# Patient Record
Sex: Male | Born: 1990 | Marital: Single | Smoking: Former smoker
Health system: Southern US, Community
[De-identification: ages and names within clinical notes are randomized; demographics above are authoritative.]

## PROBLEM LIST (undated history)

## (undated) DIAGNOSIS — E669 Obesity, unspecified: Secondary | ICD-10-CM

## (undated) DIAGNOSIS — M549 Dorsalgia, unspecified: Secondary | ICD-10-CM

## (undated) HISTORY — PX: TONSILLECTOMY: SUR1361

## (undated) HISTORY — PX: APPENDECTOMY: SHX54

---

## 2011-05-28 ENCOUNTER — Ambulatory Visit: Payer: BC Managed Care – PPO | Attending: Physician Assistant | Admitting: Physical Therapy

## 2011-05-28 DIAGNOSIS — M545 Low back pain, unspecified: Secondary | ICD-10-CM | POA: Insufficient documentation

## 2011-05-28 DIAGNOSIS — IMO0001 Reserved for inherently not codable concepts without codable children: Secondary | ICD-10-CM | POA: Insufficient documentation

## 2011-05-29 ENCOUNTER — Ambulatory Visit: Payer: BC Managed Care – PPO | Admitting: Rehabilitation

## 2011-06-02 ENCOUNTER — Ambulatory Visit: Payer: BC Managed Care – PPO | Attending: Physician Assistant | Admitting: Rehabilitation

## 2011-06-02 DIAGNOSIS — M545 Low back pain, unspecified: Secondary | ICD-10-CM | POA: Insufficient documentation

## 2011-06-02 DIAGNOSIS — IMO0001 Reserved for inherently not codable concepts without codable children: Secondary | ICD-10-CM | POA: Insufficient documentation

## 2011-06-05 ENCOUNTER — Ambulatory Visit: Payer: BC Managed Care – PPO | Admitting: Rehabilitation

## 2011-06-09 ENCOUNTER — Ambulatory Visit: Payer: BC Managed Care – PPO | Admitting: Physical Therapy

## 2011-06-11 ENCOUNTER — Ambulatory Visit: Payer: BC Managed Care – PPO | Admitting: Rehabilitation

## 2011-06-16 ENCOUNTER — Ambulatory Visit: Payer: BC Managed Care – PPO | Admitting: Rehabilitation

## 2011-06-18 ENCOUNTER — Ambulatory Visit: Payer: BC Managed Care – PPO | Admitting: Physical Therapy

## 2013-07-31 ENCOUNTER — Emergency Department (HOSPITAL_BASED_OUTPATIENT_CLINIC_OR_DEPARTMENT_OTHER)
Admission: EM | Admit: 2013-07-31 | Discharge: 2013-07-31 | Disposition: A | Discharge status: Home / Self Care | Payer: BC Managed Care – PPO | Attending: Emergency Medicine | Admitting: Emergency Medicine

## 2013-07-31 ENCOUNTER — Encounter (HOSPITAL_BASED_OUTPATIENT_CLINIC_OR_DEPARTMENT_OTHER): Payer: Self-pay | Admitting: Emergency Medicine

## 2013-07-31 DIAGNOSIS — S39012A Strain of muscle, fascia and tendon of lower back, initial encounter: Secondary | ICD-10-CM

## 2013-07-31 DIAGNOSIS — S335XXA Sprain of ligaments of lumbar spine, initial encounter: Secondary | ICD-10-CM | POA: Insufficient documentation

## 2013-07-31 DIAGNOSIS — Z87891 Personal history of nicotine dependence: Secondary | ICD-10-CM | POA: Insufficient documentation

## 2013-07-31 DIAGNOSIS — E669 Obesity, unspecified: Secondary | ICD-10-CM | POA: Insufficient documentation

## 2013-07-31 DIAGNOSIS — X58XXXA Exposure to other specified factors, initial encounter: Secondary | ICD-10-CM | POA: Insufficient documentation

## 2013-07-31 DIAGNOSIS — Y9389 Activity, other specified: Secondary | ICD-10-CM | POA: Insufficient documentation

## 2013-07-31 DIAGNOSIS — Y929 Unspecified place or not applicable: Secondary | ICD-10-CM | POA: Insufficient documentation

## 2013-07-31 HISTORY — DX: Obesity, unspecified: E66.9

## 2013-07-31 HISTORY — DX: Dorsalgia, unspecified: M54.9

## 2013-07-31 MED ORDER — METHOCARBAMOL 500 MG PO TABS: 500.0000 mg | ORAL_TABLET | Freq: Two times a day (BID) | ORAL | Status: DC

## 2013-07-31 MED ORDER — IBUPROFEN 800 MG PO TABS: 800.0000 mg | ORAL_TABLET | Freq: Three times a day (TID) | ORAL | Status: DC

## 2013-07-31 MED ORDER — HYDROCODONE-ACETAMINOPHEN 5-325 MG PO TABS: 2.0000 | ORAL_TABLET | ORAL | Status: DC | PRN

## 2013-07-31 NOTE — ED Provider Notes (Signed)
CSN: [id number]     Arrival date & time 07/31/13  1301 History   First MD Initiated Contact with Patient 07/31/13 1331     Chief Complaint  Patient presents with  . Back Pain     (Consider location/radiation/quality/duration/timing/severity/associated sxs/prior Treatment) Patient is a 23 y.o. male presenting with back pain. The history is provided by the patient. No language interpreter was used.  Back Pain Location:  Lumbar spine Quality:  Aching Radiates to:  L posterior upper leg and R posterior upper leg Pain severity:  Moderate Pain is:  Same all the time Onset quality:  Gradual Duration:  3 days Timing:  Constant Progression:  Worsening Relieved by:  Nothing Ineffective treatments:  None tried   Past Medical History  Diagnosis Date  . Back pain   . Obesity    Past Surgical History  Procedure Laterality Date  . Appendectomy    . Tonsillectomy     No family history on file. History  Substance Use Topics  . Smoking status: Former Games developermoker  . Smokeless tobacco: Current User  . Alcohol Use: Yes    Review of Systems  Musculoskeletal: Positive for back pain.  All other systems reviewed and are negative.      Allergies  Review of patient's allergies indicates no known allergies.  Home Medications   Current Outpatient Rx  Name  Route  Sig  Dispense  Refill  . HYDROcodone-acetaminophen (NORCO/VICODIN) 5-325 MG per tablet   Oral   Take 2 tablets by mouth every 4 (four) hours as needed.   20 tablet   0   . ibuprofen (ADVIL,MOTRIN) 800 MG tablet   Oral   Take 1 tablet (800 mg total) by mouth 3 (three) times daily.   21 tablet   0   . methocarbamol (ROBAXIN) 500 MG tablet   Oral   Take 1 tablet (500 mg total) by mouth 2 (two) times daily.   20 tablet   0    BP 131/71  Pulse 86  Temp(Src) 97.7 F (36.5 C) (Oral)  Resp 20  Ht 6' (1.829 m)  Wt 375 lb (170.099 kg)  BMI 50.85 kg/m2  SpO2 98% Physical Exam  Nursing note and vitals  reviewed. Constitutional: He is oriented to person, place, and time. He appears well-developed and well-nourished.  HENT:  Head: Normocephalic.  Eyes: Conjunctivae and EOM are normal. Pupils are equal, round, and reactive to light.  Neck: Normal range of motion.  Cardiovascular: Normal rate, regular rhythm and normal heart sounds.   Pulmonary/Chest: Effort normal and breath sounds normal.  Abdominal: Soft. He exhibits no distension.  Musculoskeletal: Normal range of motion.  Neurological: He is alert and oriented to person, place, and time.  Skin: Skin is warm.  Psychiatric: He has a normal mood and affect.    ED Course  Procedures (including critical care time) Labs Review Labs Reviewed - No data to display Imaging Review No results found.   EKG Interpretation None      MDM   Final diagnoses:  Lumbar strain    Robaxin Ibuprofen Hydrocodone Follow up with your Physician for recheck     Elson AreasLeslie K Sofia, PA-C 07/31/13 1420

## 2013-07-31 NOTE — ED Notes (Signed)
Reports chronic issues with back pain.  Reports worsened last night, pain with walking and experiencing back spasms.  Radiating down both legs.

## 2013-07-31 NOTE — ED Provider Notes (Signed)
Medical screening examination/treatment/procedure(s) were performed by non-physician practitioner and as supervising physician I was immediately available for consultation/collaboration.   EKG Interpretation None        Kendrea Cerritos, MD 07/31/13 1444 

## 2013-07-31 NOTE — Discharge Instructions (Signed)
Back Pain, Adult Back pain is very common. The pain often gets better over time. The cause of back pain is usually not dangerous. Most people can learn to manage their back pain on their own.  HOME CARE   Stay active. Start with short walks on flat ground if you can. Try to walk farther each day.  Do not sit, drive, or stand in one place for more than 30 minutes. Do not stay in bed.  Do not avoid exercise or work. Activity can help your back heal faster.  Be careful when you bend or lift an object. Bend at your knees, keep the object close to you, and do not twist.  Sleep on a firm mattress. Lie on your side, and bend your knees. If you lie on your back, put a pillow under your knees.  Only take medicines as told by your doctor.  Put ice on the injured area.  Put ice in a plastic bag.  Place a towel between your skin and the bag.  Leave the ice on for 15-20 minutes, 03-04 times a day for the first 2 to 3 days. After that, you can switch between ice and heat packs.  Ask your doctor about back exercises or massage.  Avoid feeling anxious or stressed. Find good ways to deal with stress, such as exercise. GET HELP RIGHT AWAY IF:   Your pain does not go away with rest or medicine.  Your pain does not go away in 1 week.  You have new problems.  You do not feel well.  The pain spreads into your legs.  You cannot control when you poop (bowel movement) or pee (urinate).  Your arms or legs feel weak or lose feeling (numbness).  You feel sick to your stomach (nauseous) or throw up (vomit).  You have belly (abdominal) pain.  You feel like you may pass out (faint). MAKE SURE YOU:   Understand these instructions.  Will watch your condition.  Will get help right away if you are not doing well or get worse. Document Released: 10/01/2007 Document Revised: 07/07/2011 Document Reviewed: 09/02/2010 [facility] Patient Information 2014 [place], [place].  Back Exercises Back  exercises help treat and prevent back injuries. The goal of back exercises is to increase the strength of your abdominal and back muscles and the flexibility of your back. These exercises should be started when you no longer have back pain. Back exercises include:  Pelvic Tilt. Lie on your back with your knees bent. Tilt your pelvis until the lower part of your back is against the floor. Hold this position 5 to 10 sec and repeat 5 to 10 times.  Knee to Chest. Pull first 1 knee up against your chest and hold for 20 to 30 seconds, repeat this with the other knee, and then both knees. This may be done with the other leg straight or bent, whichever feels better.  Sit-Ups or Curl-Ups. Bend your knees 90 degrees. Start with tilting your pelvis, and do a partial, slow sit-up, lifting your trunk only 30 to 45 degrees off the floor. Take at least 2 to 3 seconds for each sit-up. Do not do sit-ups with your knees out straight. If partial sit-ups are difficult, simply do the above but with only tightening your abdominal muscles and holding it as directed.  Hip-Lift. Lie on your back with your knees flexed 90 degrees. Push down with your feet and shoulders as you raise your hips a couple inches off the floor; hold  hold for 10 seconds, repeat 5 to 10 times. °· Back arches. Lie on your stomach, propping yourself up on bent elbows. Slowly press on your hands, causing an arch in your low back. Repeat 3 to 5 times. Any initial stiffness and discomfort should lessen with repetition over time. °· Shoulder-Lifts. Lie face down with arms beside your body. Keep hips and torso pressed to floor as you slowly lift your head and shoulders off the floor. °Do not overdo your exercises, especially in the beginning. Exercises may cause you some mild back discomfort which lasts for a few minutes; however, if the pain is more severe, or lasts for more than 15 minutes, do not continue exercises until you see your caregiver. Improvement with  exercise therapy for back problems is slow.  °See your caregivers for assistance with developing a proper back exercise program. °Document Released: 05/22/2004 Document Revised: 07/07/2011 Document Reviewed: 02/13/2011 °[employer]® Patient Information ©2014 [employer]. ° °

## 2013-08-01 NOTE — ED Notes (Signed)
Pt called requesting return to work note for tomorrow. Vickie, RN printed note and will leave at front desk.

## 2013-08-25 ENCOUNTER — Ambulatory Visit: Payer: Self-pay

## 2013-08-25 ENCOUNTER — Other Ambulatory Visit: Payer: Self-pay | Admitting: Occupational Medicine

## 2013-08-25 DIAGNOSIS — R52 Pain, unspecified: Secondary | ICD-10-CM

## 2014-01-18 ENCOUNTER — Encounter (HOSPITAL_COMMUNITY): Payer: Self-pay | Admitting: Emergency Medicine

## 2014-01-18 ENCOUNTER — Emergency Department (HOSPITAL_COMMUNITY): Payer: Worker's Compensation

## 2014-01-18 ENCOUNTER — Emergency Department (HOSPITAL_COMMUNITY)
Admission: EM | Admit: 2014-01-18 | Discharge: 2014-01-18 | Disposition: A | Discharge status: Home / Self Care | Payer: Worker's Compensation | Attending: Emergency Medicine | Admitting: Emergency Medicine

## 2014-01-18 DIAGNOSIS — S99919A Unspecified injury of unspecified ankle, initial encounter: Secondary | ICD-10-CM

## 2014-01-18 DIAGNOSIS — S82891A Other fracture of right lower leg, initial encounter for closed fracture: Secondary | ICD-10-CM

## 2014-01-18 DIAGNOSIS — Z87891 Personal history of nicotine dependence: Secondary | ICD-10-CM | POA: Diagnosis not present

## 2014-01-18 DIAGNOSIS — S8990XA Unspecified injury of unspecified lower leg, initial encounter: Secondary | ICD-10-CM | POA: Insufficient documentation

## 2014-01-18 DIAGNOSIS — S92109A Unspecified fracture of unspecified talus, initial encounter for closed fracture: Secondary | ICD-10-CM | POA: Diagnosis not present

## 2014-01-18 DIAGNOSIS — Y9289 Other specified places as the place of occurrence of the external cause: Secondary | ICD-10-CM | POA: Insufficient documentation

## 2014-01-18 DIAGNOSIS — X500XXA Overexertion from strenuous movement or load, initial encounter: Secondary | ICD-10-CM | POA: Diagnosis not present

## 2014-01-18 DIAGNOSIS — S99929A Unspecified injury of unspecified foot, initial encounter: Secondary | ICD-10-CM

## 2014-01-18 DIAGNOSIS — Y990 Civilian activity done for income or pay: Secondary | ICD-10-CM | POA: Diagnosis not present

## 2014-01-18 MED ORDER — OXYCODONE-ACETAMINOPHEN 5-325 MG PO TABS: 1.0000 | ORAL_TABLET | Freq: Four times a day (QID) | ORAL | Status: AC | PRN

## 2014-01-18 NOTE — ED Provider Notes (Signed)
Medical screening examination/treatment/procedure(s) were performed by non-physician practitioner and as supervising physician I was immediately available for consultation/collaboration.   EKG Interpretation None        David H Yao, MD 01/18/14 2350 

## 2014-01-18 NOTE — ED Provider Notes (Signed)
CSN: [id number]     Arrival date & time 01/18/14  1641 History  This chart was scribed for non-physician practitioner, Junius Finner, PA-C,working with Richardean Canal, MD, by Karle Plumber, ED Scribe. This patient was seen in room TR08C/TR08C and the patient's care was started at 5:58 PM.  Chief Complaint  Patient presents with  . Ankle Pain   Patient is a 23 y.o. male presenting with ankle pain. The history is provided by the patient. No language interpreter was used.  Ankle Pain  HPI Comments:  Edward Park is a 23 y.o. morbidly obese male with PMH of chronic back pain who presents to the Emergency Department complaining of sudden onset aching, throbbing right ankle pain secondary to twisting his ankle approximately three hours ago. Pt reports associated numbness of the foot. He reports the pain at 6 or 7/10. Pt has iced the area with minimal relief of the pain. He has not taken any medications for his pain. He states he heard and felt a pop at the time of the accident. He states he has never injured this ankle before. He denies any bruising, wound, nausea, vomiting or falling at the time of the incident. He denies right knee or hip pain.  Past Medical History  Diagnosis Date  . Back pain   . Obesity    Past Surgical History  Procedure Laterality Date  . Appendectomy    . Tonsillectomy     History reviewed. No pertinent family history. History  Substance Use Topics  . Smoking status: Former Games developer  . Smokeless tobacco: Current User  . Alcohol Use: Yes    Review of Systems  Gastrointestinal: Negative for nausea and vomiting.  Musculoskeletal: Positive for arthralgias and joint swelling.  Skin: Negative for color change and wound.  All other systems reviewed and are negative.   Allergies  Review of patient's allergies indicates no known allergies.  Home Medications   Prior to Admission medications   Medication Sig Start Date End Date Taking? Authorizing Provider   oxyCODONE-acetaminophen (PERCOCET/ROXICET) 5-325 MG per tablet Take 1-2 tablets by mouth every 6 (six) hours as needed for moderate pain or severe pain. 01/18/14   Junius Finner, PA-C   .BP 149/58  Pulse 108  Temp(Src) 98.2 F (36.8 C) (Oral)  Resp 18  Ht 6' (1.829 m)  Wt 350 lb (158.759 kg)  BMI 47.46 kg/m2  SpO2 95% Physical Exam  Nursing note and vitals reviewed. Constitutional: He is oriented to person, place, and time. He appears well-developed and well-nourished.  Morbidly obese.  HENT:  Head: Normocephalic and atraumatic.  Eyes: EOM are normal.  Neck: Normal range of motion.  Cardiovascular: Normal rate.   Pedal pulses 2+ of right foot.  Pulmonary/Chest: Effort normal.  Musculoskeletal: Normal range of motion. He exhibits edema and tenderness.  Tenderness to palpation of lateral aspect of right ankle with moderate edema. Tenderness to palpation over lateral malleolus. Decreased ROM of right ankle and toes secondary to pain.  Neurological: He is alert and oriented to person, place, and time.  Minimally decreased sensation in right foot to light touch.   Skin: Skin is warm and dry.  Thickened, yellow toe nails of right foot.  Psychiatric: He has a normal mood and affect. His behavior is normal.    ED Course  Procedures (including critical care time) DIAGNOSTIC STUDIES: Oxygen Saturation is 95% on RA, adequate by my interpretation.   COORDINATION OF CARE: 6:00 PM- Offered pain medication but pt declined. Will  wait for X-Rays to result. Pt verbalizes understanding and agrees to plan.  Medications - No data to display  Labs Review Labs Reviewed - No data to display  Imaging Review Dg Ankle Complete Right  01/18/2014   CLINICAL DATA:  Twisted right ankle today and heard a it, swelling over lateral malleolus, bruising on medial malleolus  EXAM: RIGHT ANKLE - COMPLETE 3+ VIEW  COMPARISON:  12/15/2007  FINDINGS: There is no evidence of disruption of the mortise. There is  moderate soft tissue swelling surrounding the ankle. The medial and lateral malleoli are intact. There is a tiny avulsion fragment off of the lateral aspect of the talus. This is not seen on prior studies.  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 There is a small ankle joint effusion. There is a moderate calcaneal spur.  IMPRESSION: Ankle joint effusion with evidence sprain and with tiny avulsion fracture off of the lateral inferior talus.   Electronically Signed   By: Esperanza Heir M.D.   On: 01/18/2014 18:58     EKG Interpretation None      MDM   Final diagnoses:  Avulsion fracture of right ankle, closed, initial encounter    Pt is a 23yo male presenting to ED with c/o right ankle pain and swelling after twisting his foot at work earlier today.  Right foot is vascularly in tact with minimal decreased sensation to light touch in right foot.  Tender to palpation with moderate edema.  Plain films: significant for effusion w/ evidence of sprain with tiny avulsion fracture off lateral inferior talus.  Will place pt in cam walker boot and provided crutches. Work note provided. Advised to f/u with Dr. Ave Filter, [facility], in 1 week for further evaluation and treatment of ankle fracture. Home care instructions provided. Return precautions provided. Pt verbalized understanding and agreement with tx plan.   I personally performed the services described in this documentation, which was scribed in my presence. The recorded information has been reviewed and is accurate.   Junius Finner, PA-C 01/18/14 2226

## 2014-01-18 NOTE — ED Notes (Signed)
Patient returned from X-ray 

## 2014-01-18 NOTE — Discharge Instructions (Signed)
Ankle Fracture with Rehab Two bones in the ankle, the shinbone (tibia) and the bone of the outer ankle and lower leg (fibula), are susceptible to being fractured. The fracture may be a complete or an incomplete break of the bone. It is also common for the ligaments of the ankle joint to be injured at the same time as a fracture. SYMPTOMS   Severe pain in the ankle at the time of injury and/or when trying to move the ankle.  Feeling of popping or tearing in the inner or outer part of the ankle, sometimes as if the ankle joint was temporarily dislocated and popped back into place.  Cracking or other sounds may be heard at the time of fracture.  Severe tenderness in the ankle.  Swelling in the ankle and foot  Blisters around the ankle (uncommon).  Bleeding and bruising (contusion) in the ankle and foot.  Inability to stand or bear weight on the injured foot.  Visible deformity if the fracture is complete and the bone fragments separate enough to distort normal leg contours.  Numbness and coldness in the foot if the blood supply is impaired. CAUSES  Bones break when subjected to a force that is greater than the their strength. Most fractures are due to direct trauma, such as being hit with an object or falling.  Fractured may also be caused by indirect stress, such as twisting, pivoting, or violent muscle contraction . RISK INCREASES WITH:  Sports that require quick changes in direction (football, soccer, or skiing).  Sports that require jumping (basketball, volleyball, distance jumping, or high jumping).  Walking or running on uneven or rough surfaces.  Shoes with inadequate support to prevent the foot and ankle from rolling over when stress occurs.  Bony abnormalities (osteoporosis or bone tumors).  Metabolic disorders, hormone problems, and nutritional deficiencies and disorders.  Poor strength and flexibility  Previous ankle injury. PREVENTION   Warm up and stretch  properly before activity.  Maintain physical fitness:  Leg and ankle strength.  Flexibility and endurance.  Cardiovascular fitness.  Wear properly fitted protective equipment (high-top shoes or when appropriate and ankle bracing, taping, or splinting), especially for the first 12 months after an ankle injury. PROGNOSIS  If treated properly, ankle fractures typically heal well. RELATED COMPLICATIONS   Failure to heal (nonunion).  Healing in poor position (malunion).  Arrest of normal bone growth in children.  Proneness to repeated ankle injury.  Stiff ankle.  Unstable or arthritic ankle.  Infected skin blisters.  Prolonged healing time if activity is resumed too quickly. Risks of surgery, including infection, bleeding, injury to nerves (numbness, weakness, paralysis), and need for further surgery. TREATMENT  Treatment initially consists of ice and medication to help reduce pain and inflammation. The joint must be immobilized to allow for healing. If the fracture is where the bones are out of alignment (displaced), then surgery may be necessary to realign (reduce) them. Surgery usually involves placing pins and screws in the bones to hold them in place while the fracture heals. After surgery the joint is immobilized. Bone growth stimulators may be used to promote bone growth, but this is uncommon, Strengthening and stretching exercises are usually necessary after immobilization in order to regain strength and a full range of motion. These exercises may be completed at home or with a therapist. If pins and screws are placed in the bone, they are not usually removed unless they become a source of pain. MEDICATION   If pain medication is necessary,  then nonsteroidal anti-inflammatory medications, such as aspirin and ibuprofen, or other minor pain relievers, such as acetaminophen, are often recommended.  Do not take pain medication within 7 days before surgery.  Prescription pain  relievers may be prescribed if deemed necessary by your caregiver. Use only as directed and only as much as you need. SEEK MEDICAL CARE IF:   Symptoms get worse or do not improve in 2 weeks despite treatment.  The following occur after immobilization or surgery:  Swelling above or below the fracture site.  Severe, persistent pain.  Blue or gray skin below the fracture site, especially under the nails, or numbness or loss of feeling below the fracture site. Report any of these signs immediately.  New, unexplained symptoms develop (drugs used in treatment may produce side effects). EXERCISES      Hold stretches for 20-30 seconds, repeat 2-3 times each, perform exercises 2-3 times a day.   RANGE OF MOTION (ROM) AND STRETCHING EXERCISES - Ankle Fracture These exercises may help you when beginning to rehabilitate your injury. Your symptoms may resolve with or without further involvement from your physician, physical therapist or athletic trainer. While completing these exercises, remember:   Restoring tissue flexibility helps normal motion to return to the joints. This allows healthier, less painful movement and activity.  An effective stretch should be held for at least 30 seconds.  A stretch should never be painful. You should only feel a gentle lengthening or release in the stretched tissue. RANGE OF MOTION - Dorsi/Plantar Flexion  While sitting with your right / left knee straight, draw the top of your foot upwards by flexing your ankle. Then reverse the motion, pointing your toes downward.  Hold each position for __________ seconds.  After completing your first set of exercises, repeat this exercise with your knee bent. Repeat __________ times. Complete this exercise __________ times per day.  RANGE OF MOTION- Ankle Plantar Flexion   Sit with your right / left leg crossed over your opposite knee.  Use your opposite hand to pull the top of your foot and toes toward you.  You  should feel a gentle stretch on the top of your foot/ankle. Hold this position for __________ seconds. Repeat __________ times. Complete __________ times per day.  RANGE OF MOTION - Ankle Eversion  Sit with your right / left ankle crossed over your opposite knee.  Grip your foot with your opposite hand, placing your thumb on the top of your foot and your fingers across the bottom of your foot.  Gently push your foot downward with a slight rotation so your littlest toes rise slightly  You should feel a gentle stretch on the inside of your ankle. Hold the stretch for __________ seconds. Repeat __________ times. Complete this exercise __________ times per day.  RANGE OF MOTION - Ankle Inversion  Sit with your right / left ankle crossed over your opposite knee.  Grip your foot with your opposite hand, placing your thumb on the bottom of your foot and your fingers across the top of your foot.  Gently pull your foot so the smallest toe comes toward you and your thumb pushes the inside of the ball of your foot away from you.  You should feel a gentle stretch on the outside of your ankle. Hold the stretch for __________ seconds. Repeat __________ times. Complete this exercise __________ times per day.  RANGE OF MOTION - Ankle Alphabet  Imagine your right / left big toe is a pen.  Keeping  your hip and knee still, write out the entire alphabet with your "pen." Make the letters as large as you can without increasing any discomfort. Repeat __________ times. Complete this exercise __________ times per day.  RANGE OF MOTION - Ankle Dorsiflexion, Active Assisted   Remove shoes and sit on a chair that is preferably not on a carpeted surface.  Place right / left foot under knee. Extend your opposite leg for support.  Keeping your heel down, slide your right / left foot back toward the chair until you feel a stretch at your ankle or calf. If you do not feel a stretch, slide your bottom forward to the  edge of the chair, while still keeping your heel down.  Hold this stretch for __________ seconds. Repeat __________ times. Complete this stretch __________ times per day.  STRETCH - Gastrocsoleus   Sit with your right / left leg extended. Holding onto both ends of a belt or towel, loop it around the ball of your foot.  Keeping your right / left ankle and foot relaxed and your knee straight, pull your foot and ankle toward you using the belt/towel.  You should feel a gentle stretch behind your calf or knee. Hold this position for __________ seconds. Repeat __________ times. Complete this stretch __________ times per day.  STRENGTHENING EXERCISES - Ankle Fracture These exercises may help you when beginning to rehabilitate your injury. They may resolve your symptoms with or without further involvement from your physician, physical therapist or athletic trainer. While completing these exercises, remember:   Muscles can gain both the endurance and the strength needed for everyday activities through controlled exercises.  Complete these exercises as instructed by your physician, physical therapist or athletic trainer. Progress the resistance and repetitions only as guided.  You may experience muscle soreness or fatigue, but the pain or discomfort you are trying to eliminate should never worsen during these exercises. If this pain does worsen, stop and make certain you are following the directions exactly. If the pain is still present after adjustments, discontinue the exercise until you can discuss the trouble with your clinician. STRENGTH - Dorsiflexors  Secure a rubber exercise band/tubing to a fixed object (table, pole) and loop the other end around your right / left foot.  Sit on the floor facing the fixed object. The band/tubing should be slightly tense when your foot is relaxed.  Slowly draw your foot back toward you using your ankle and toes.  Hold this position for __________ seconds.  Slowly release the tension in the band and return your foot to the starting position. Repeat __________ times. Complete this exercise __________ times per day.  STRENGTH - Plantar-flexors  Sit with your right / left leg extended. Holding onto both ends of a rubber exercise band/tubing, loop it around the ball of your foot. Keep a slight tension in the band.  Slowly push your toes away from you, pointing them downward.  Hold this position for __________ seconds. Return slowly, controlling the tension in the band/tubing. Repeat __________ times. Complete this exercise __________ times per day.  STRENGTH - Ankle Eversion  Secure one end of a rubber exercise band/tubing to a fixed object (table, pole). Loop the other end around your foot just before your toes.  Place your fists between your knees. This will focus your strengthening at your ankle.  Drawing the band/tubing across your opposite foot, slowly, pull your little toe out and up. Make sure the band/tubing is positioned to resist the entire  motion.  Hold this position for __________ seconds.  Have your muscles resist the band/tubing as it slowly pulls your foot back to the starting position. Repeat __________ times. Complete this exercise __________ times per day.  STRENGTH - Ankle Inversion  Secure one end of a rubber exercise band/tubing to a fixed object (table, pole). Loop the other end around your foot just before your toes.  Place your fists between your knees. This will focus your strengthening at your ankle.  Slowly, pull your big toe up and in, making sure the band/tubing is positioned to resist the entire motion.  Hold this position for __________ seconds.  Have your muscles resist the band/tubing as it slowly pulls your foot back to the starting position. Repeat __________ times. Complete this exercises __________ times per day.  STRENGTH - Towel Curls  Sit in a chair positioned on a non-carpeted surface.  Place  your foot on a towel, keeping your heel on the floor.  Pull the towel toward your heel by only curling your toes. Keep your heel on the floor.  If instructed by your physician, physical therapist or athletic trainer, add weight at the end of the towel. Repeat __________ times. Complete this exercise __________ times per day. STRENGTH - Plantar-flexors, Standing   Stand with your feet shoulder width apart. Steady yourself with a wall or table using as little support as needed.  Keeping your weight evenly spread over the width of your feet, rise up on your toes.*  Hold this position for __________ seconds. Repeat __________ times. Complete this exercise __________ times per day.  *If this is too easy, shift your weight toward your right / left leg until you feel challenged. Ultimately, you may be asked to do this exercise with your right / left foot only. Document Released: 11/13/2004 Document Revised: 07/07/2011 Document Reviewed: 07/27/2008 [facility] Patient Information 2015 [place], [place]. This information is not intended to replace advice given to you by your health care provider. Make sure you discuss any questions you have with your health care provider.

## 2014-01-18 NOTE — ED Notes (Signed)
Paged Ortho  

## 2014-01-18 NOTE — Progress Notes (Signed)
Orthopedic Tech Progress Note Patient Details:  Edward Park 1991/04/07 [id number]  Ortho Devices Type of Ortho Device: CAM walker;Crutches Ortho Device/Splint Location: rle Ortho Device/Splint Interventions: Application   [name] 01/18/2014, 8:25 PM

## 2014-01-18 NOTE — ED Notes (Signed)
Reports twisting his right ankle this afternoon and now having pain and swelling.

## 2015-05-13 IMAGING — CR DG HAND COMPLETE 3+V*L*
3 series · 3 of 3 positions shown · non-contrast
Comparison: None.

CLINICAL DATA: Left arm pain

EXAM:
LEFT HAND - COMPLETE 3+ VIEW; LEFT WRIST - COMPLETE 3+ VIEW; LEFT
FOREARM - 2 VIEW

[view not recorded (1 of 3)]
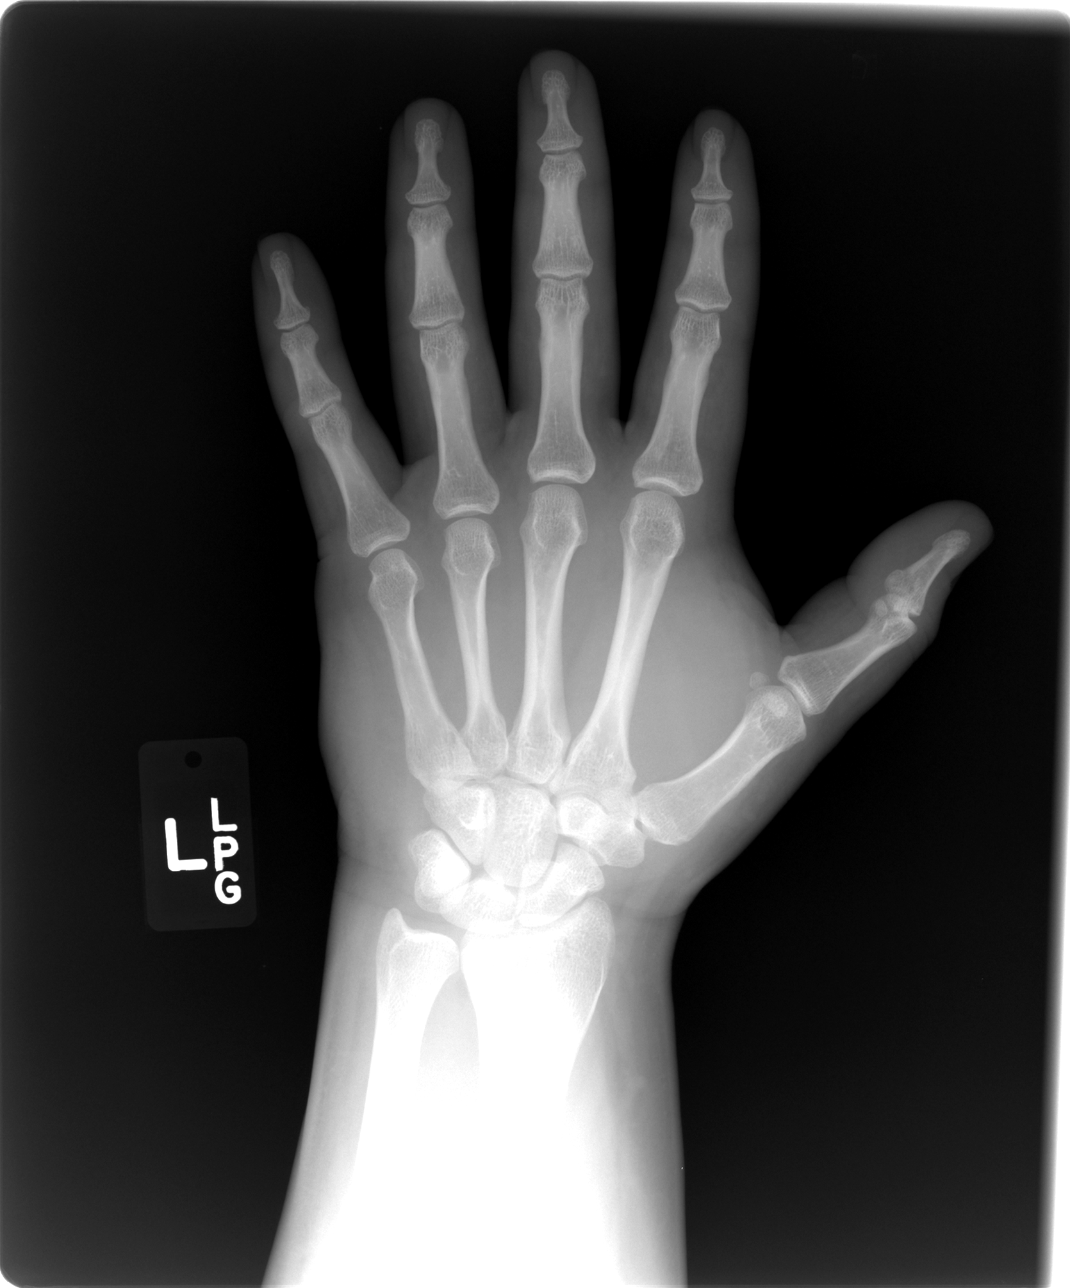

[view not recorded (2 of 3)]
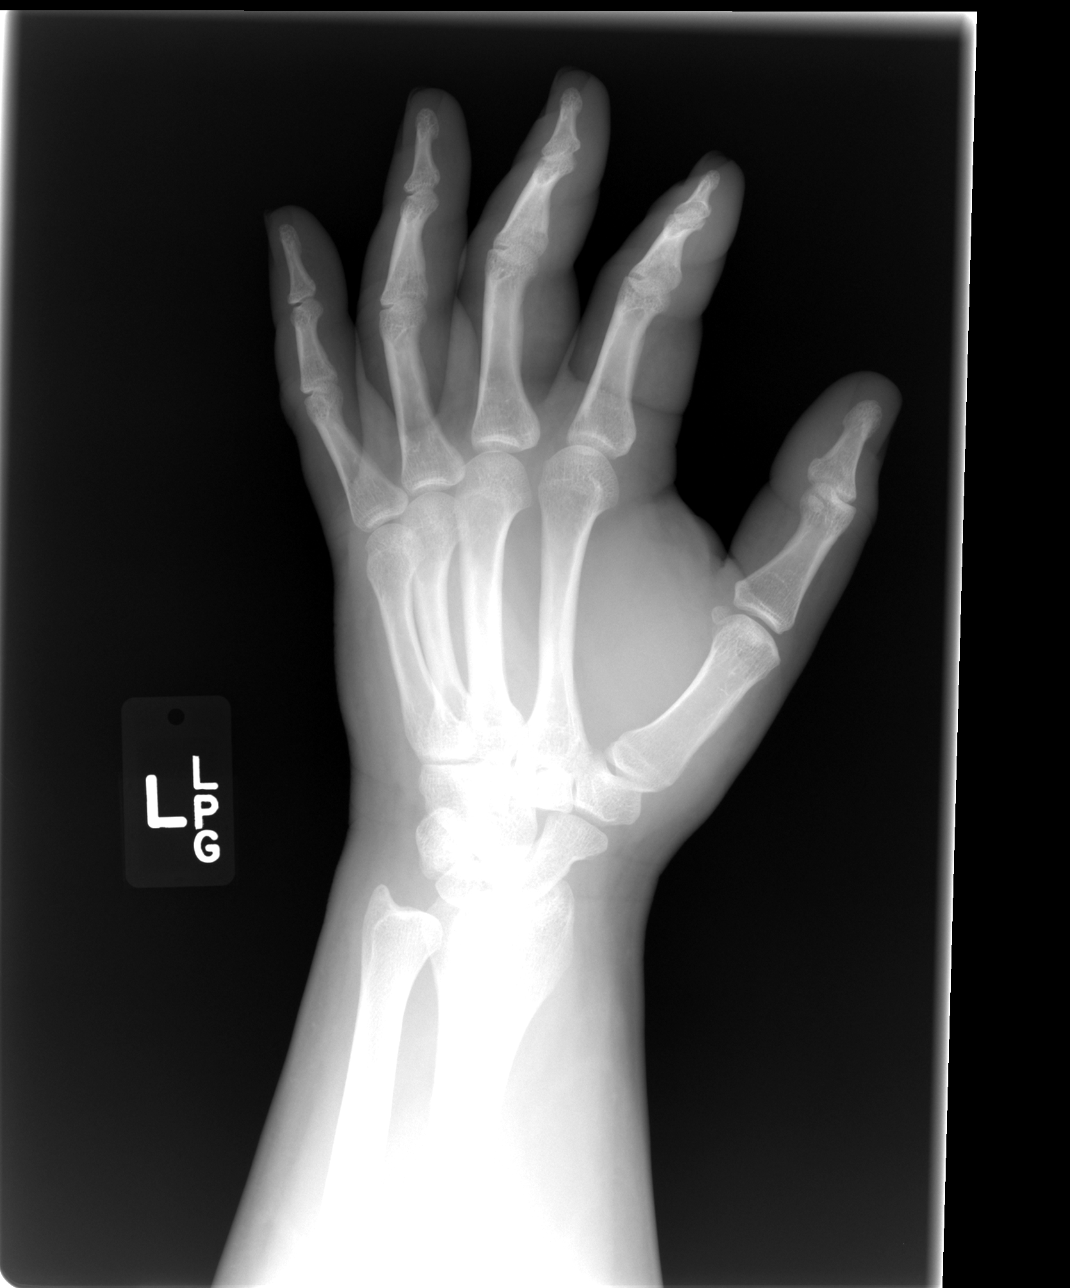

[view not recorded (3 of 3)]
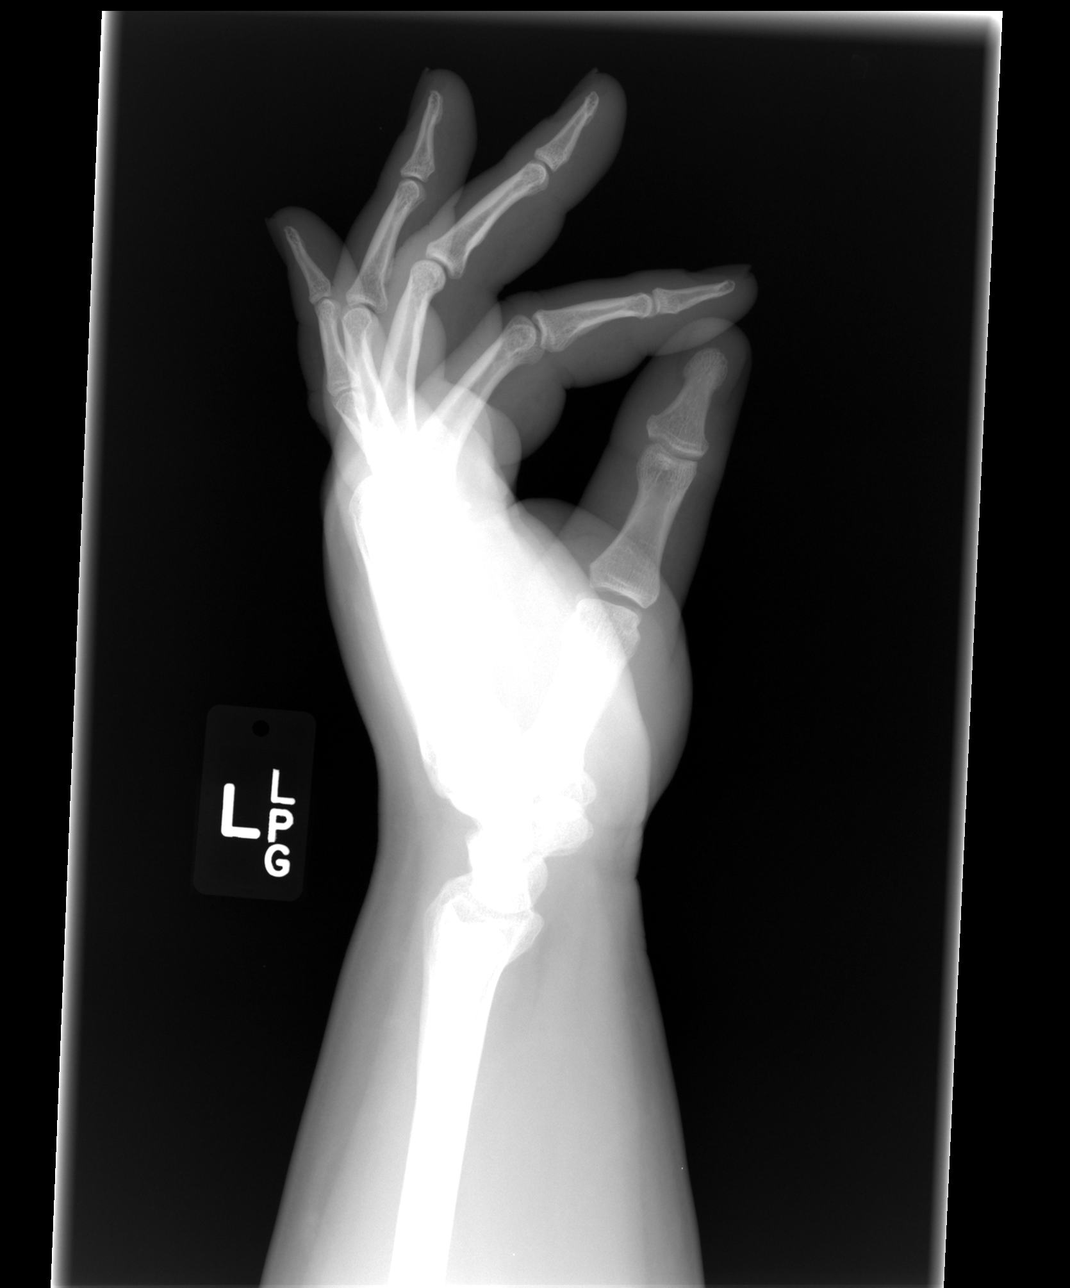

[3 of 3 positions shown; findings below may reference images not displayed]

FINDINGS: Left wrist: There is no fracture or dislocation. There is normal
alignment. Normal soft tissues. There is no radiopaque foreign body.

Left Hand: The left hand demonstrates no fracture or dislocation.
There is no soft tissue swelling.

Left radius/ulna: There is no evidence of fracture or dislocation.
There is no evidence of arthropathy or other focal bone abnormality.
Soft tissues are unremarkable.
IMPRESSION: 1. No acute osseous injury of the left wrist.
2. No acute osseous injury left hand.
3. No acute osseous injury of the left radius/ulna.

## 2015-05-13 IMAGING — CR DG WRIST COMPLETE 3+V*L*
4 series · 4 of 4 positions shown · non-contrast
Comparison: None.

CLINICAL DATA: Left arm pain

EXAM:
LEFT HAND - COMPLETE 3+ VIEW; LEFT WRIST - COMPLETE 3+ VIEW; LEFT
FOREARM - 2 VIEW

[view not recorded (1 of 4)]
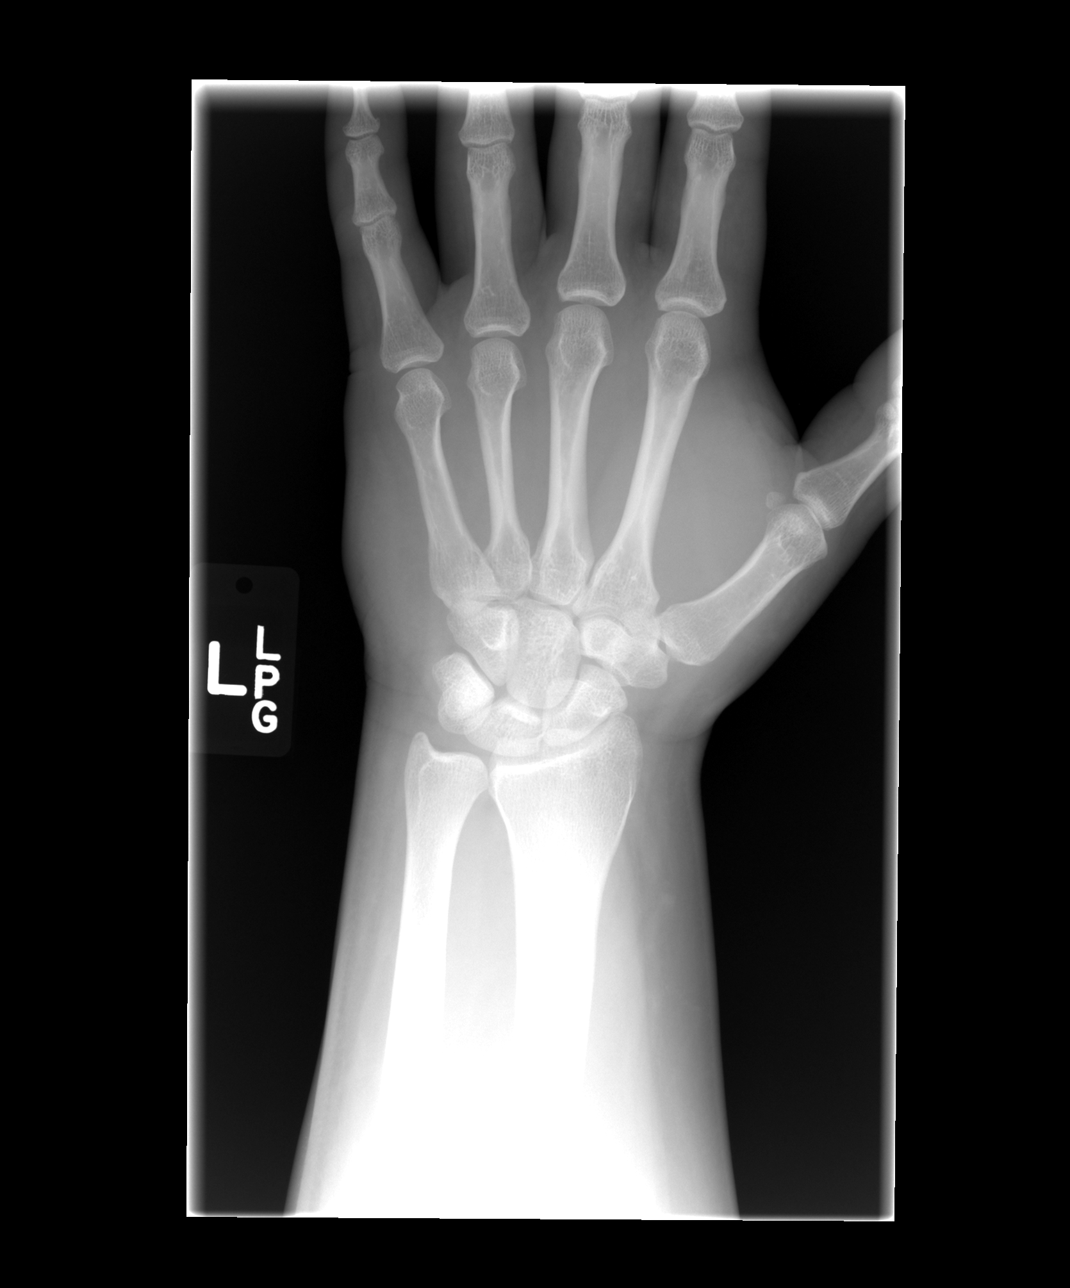

[view not recorded (2 of 4)]
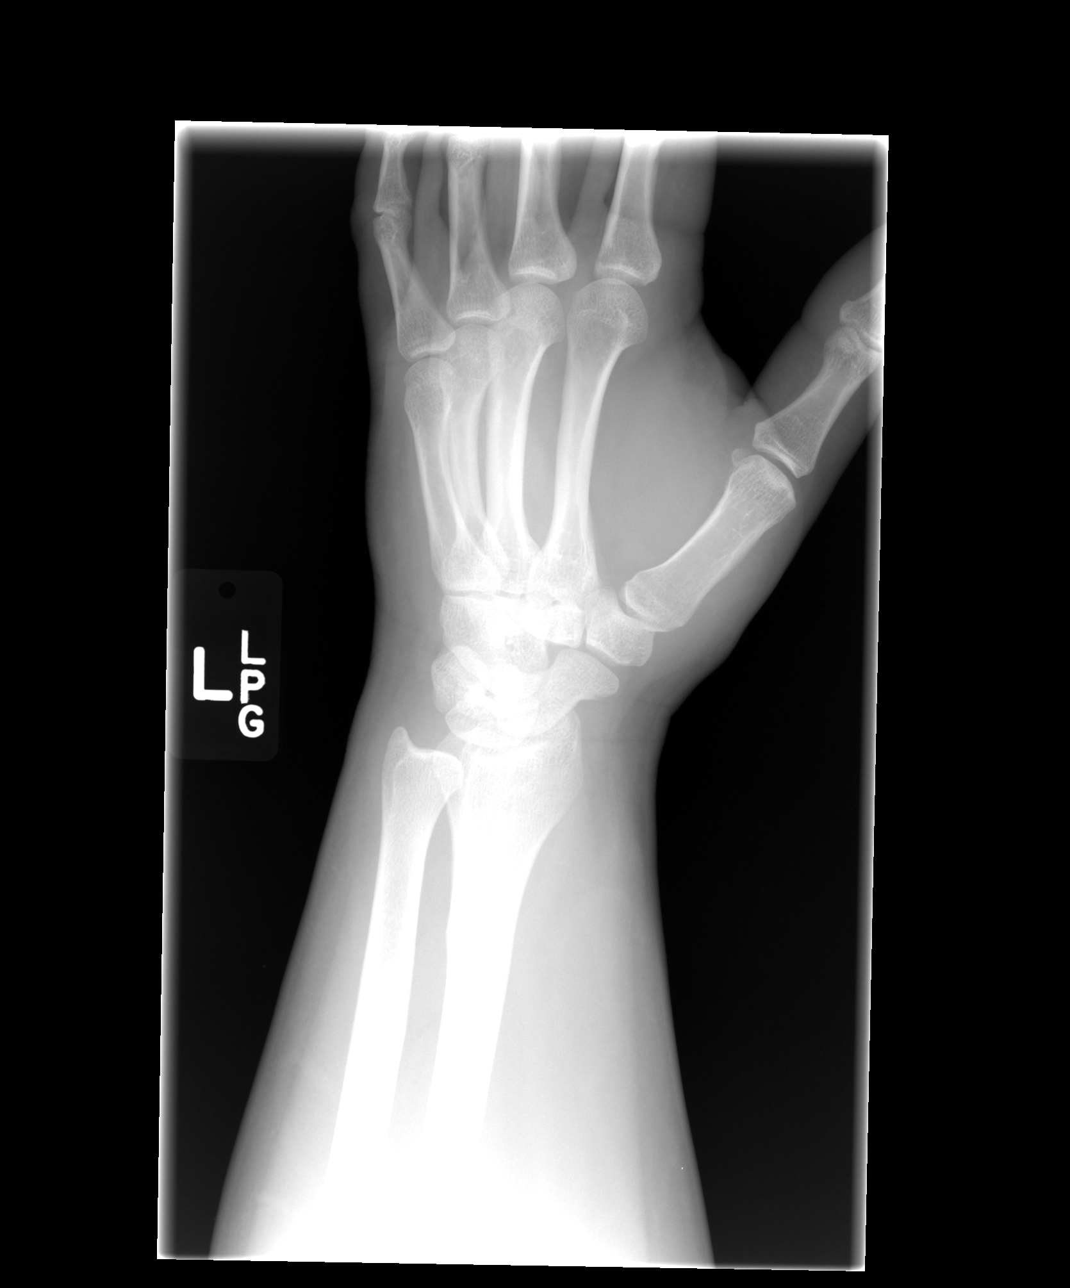

[view not recorded (3 of 4)]
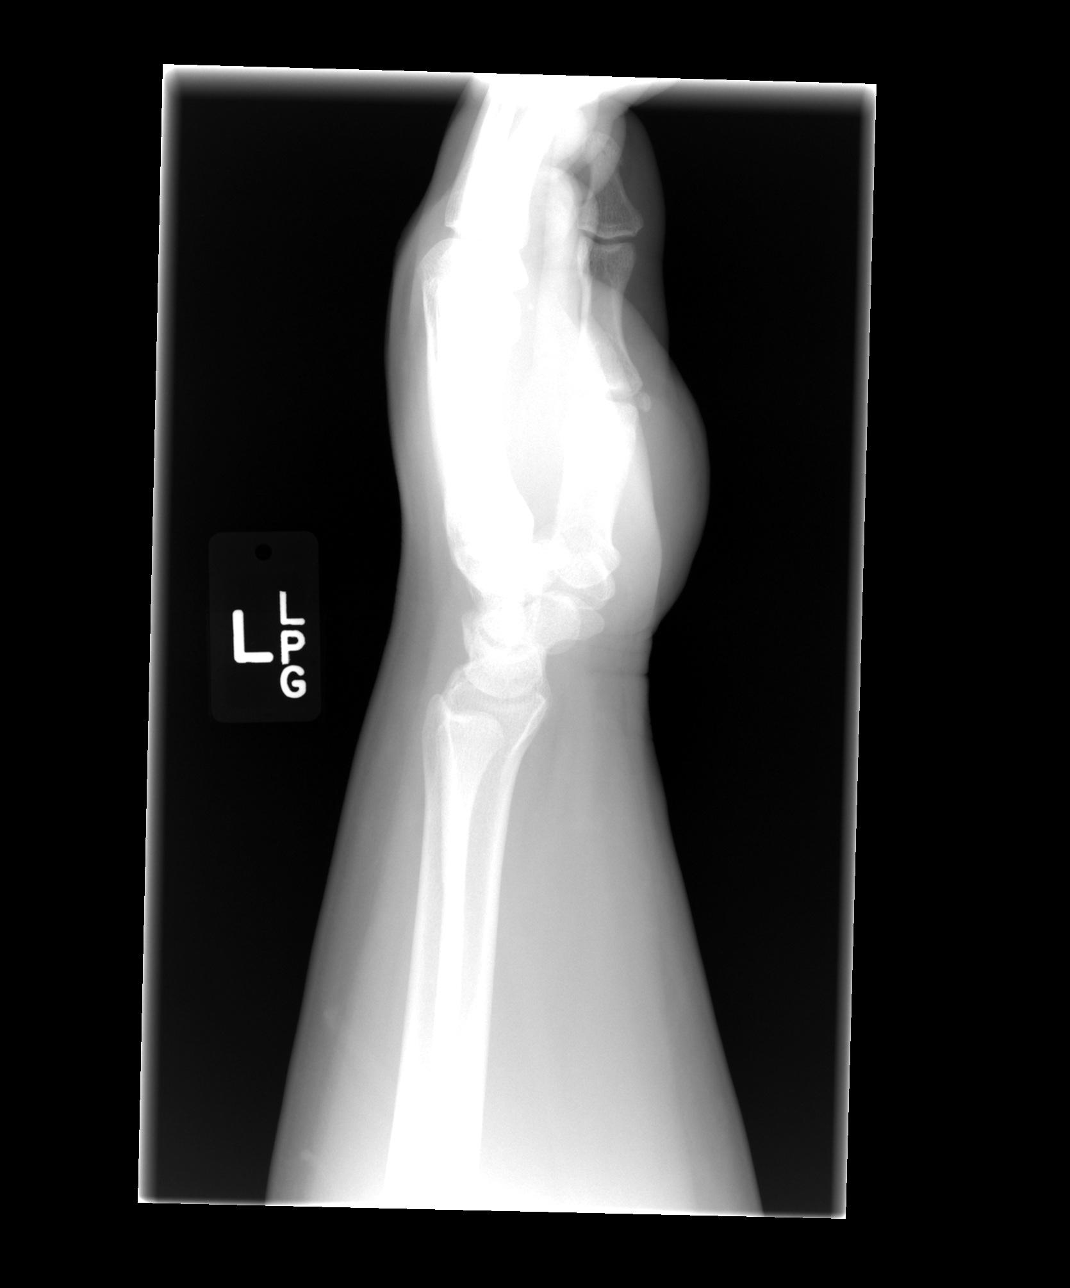

[view not recorded (4 of 4)]
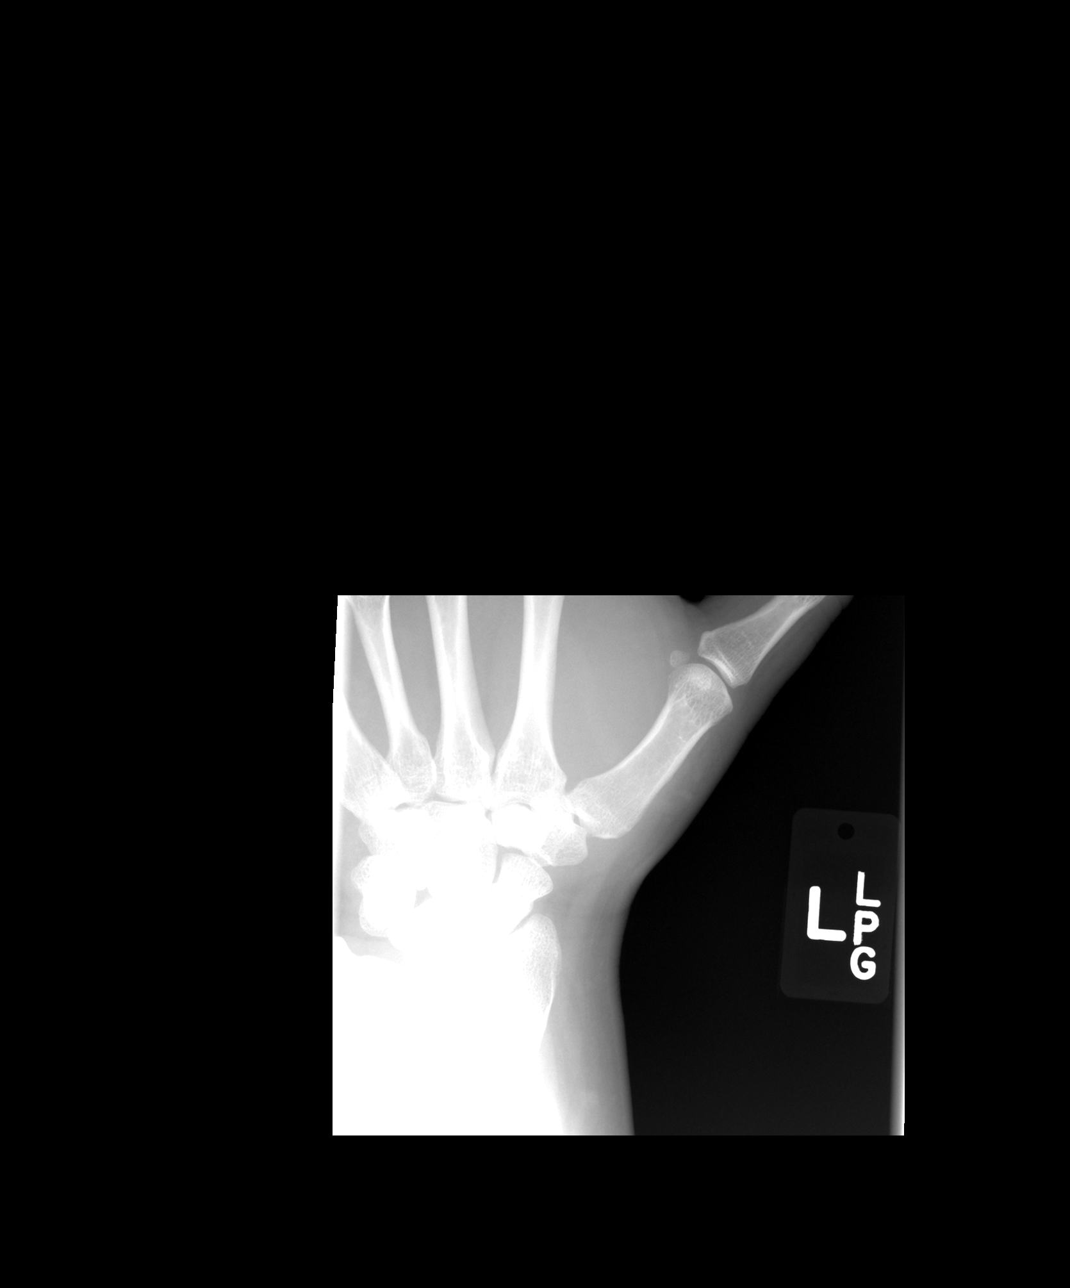

[4 of 4 positions shown; findings below may reference images not displayed]

FINDINGS: Left wrist: There is no fracture or dislocation. There is normal
alignment. Normal soft tissues. There is no radiopaque foreign body.

Left Hand: The left hand demonstrates no fracture or dislocation.
There is no soft tissue swelling.

Left radius/ulna: There is no evidence of fracture or dislocation.
There is no evidence of arthropathy or other focal bone abnormality.
Soft tissues are unremarkable.
IMPRESSION: 1. No acute osseous injury of the left wrist.
2. No acute osseous injury left hand.
3. No acute osseous injury of the left radius/ulna.

## 2015-10-06 IMAGING — CR DG ANKLE COMPLETE 3+V*R*
3 series · 3 of 3 positions shown · non-contrast
Comparison: 12/15/2007

CLINICAL DATA: Twisted right ankle today and heard a it, swelling
over lateral malleolus, bruising on medial malleolus

EXAM:
RIGHT ANKLE - COMPLETE 3+ VIEW

[t ankle joint ap right]
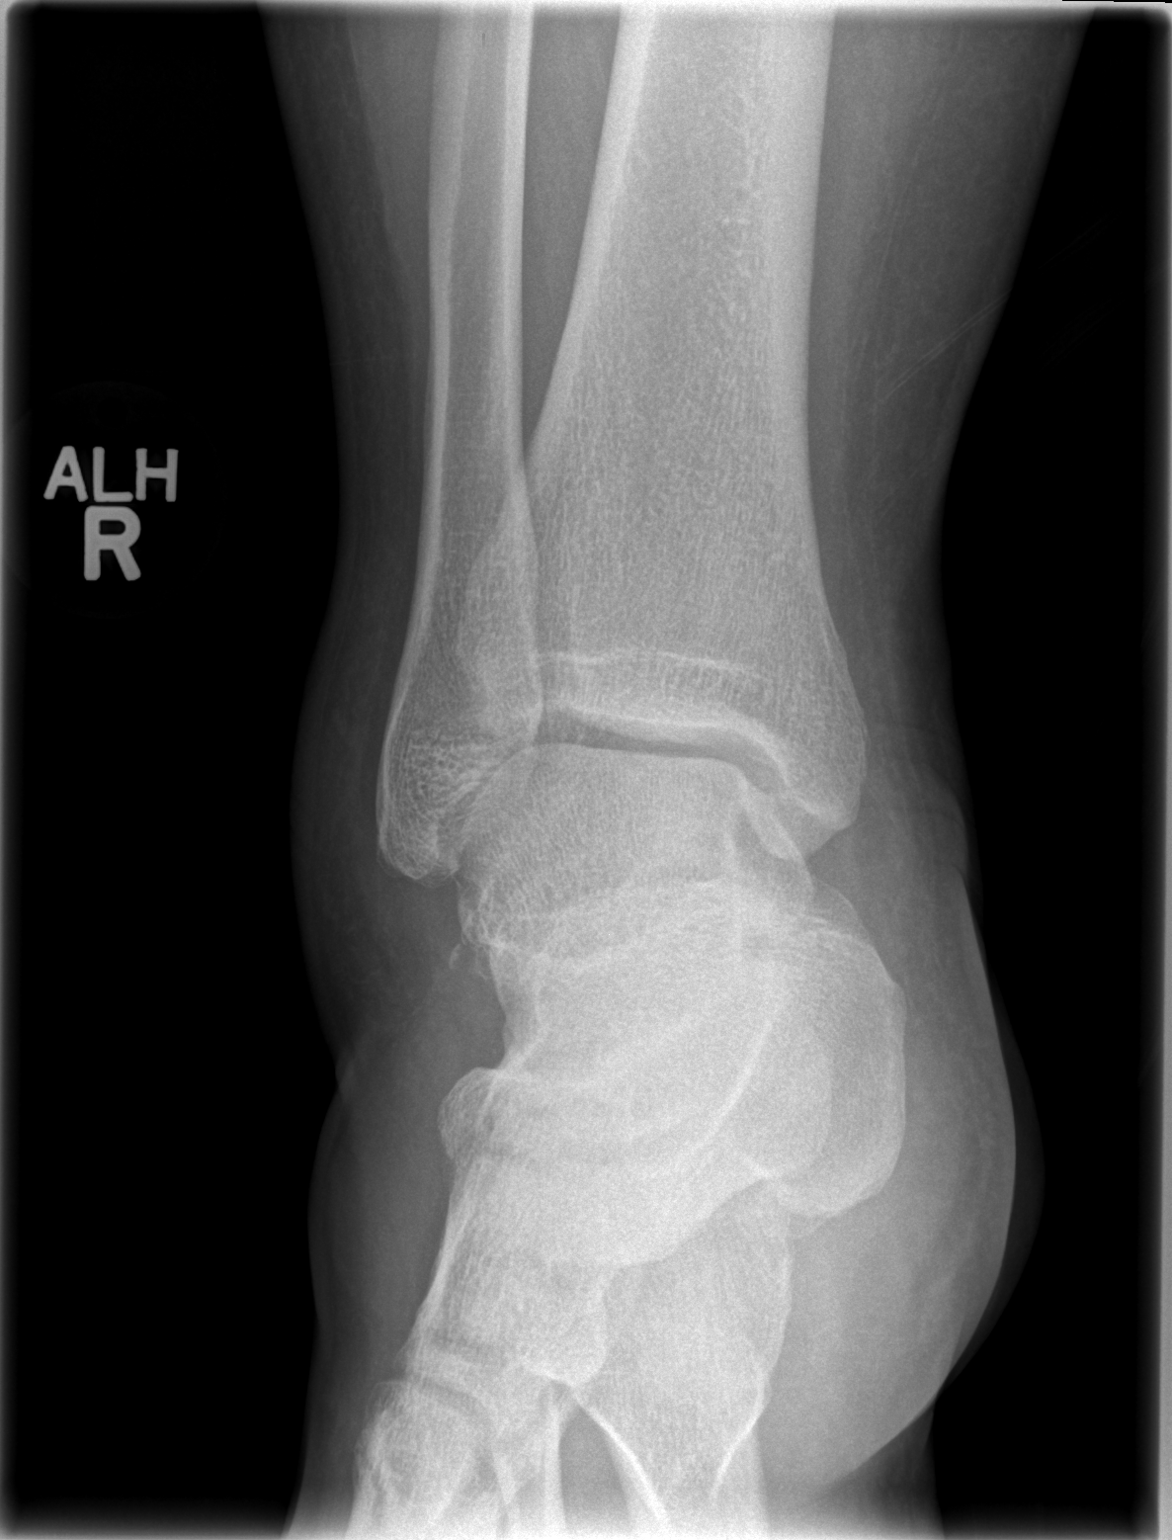

[t ankle joint oblique right]
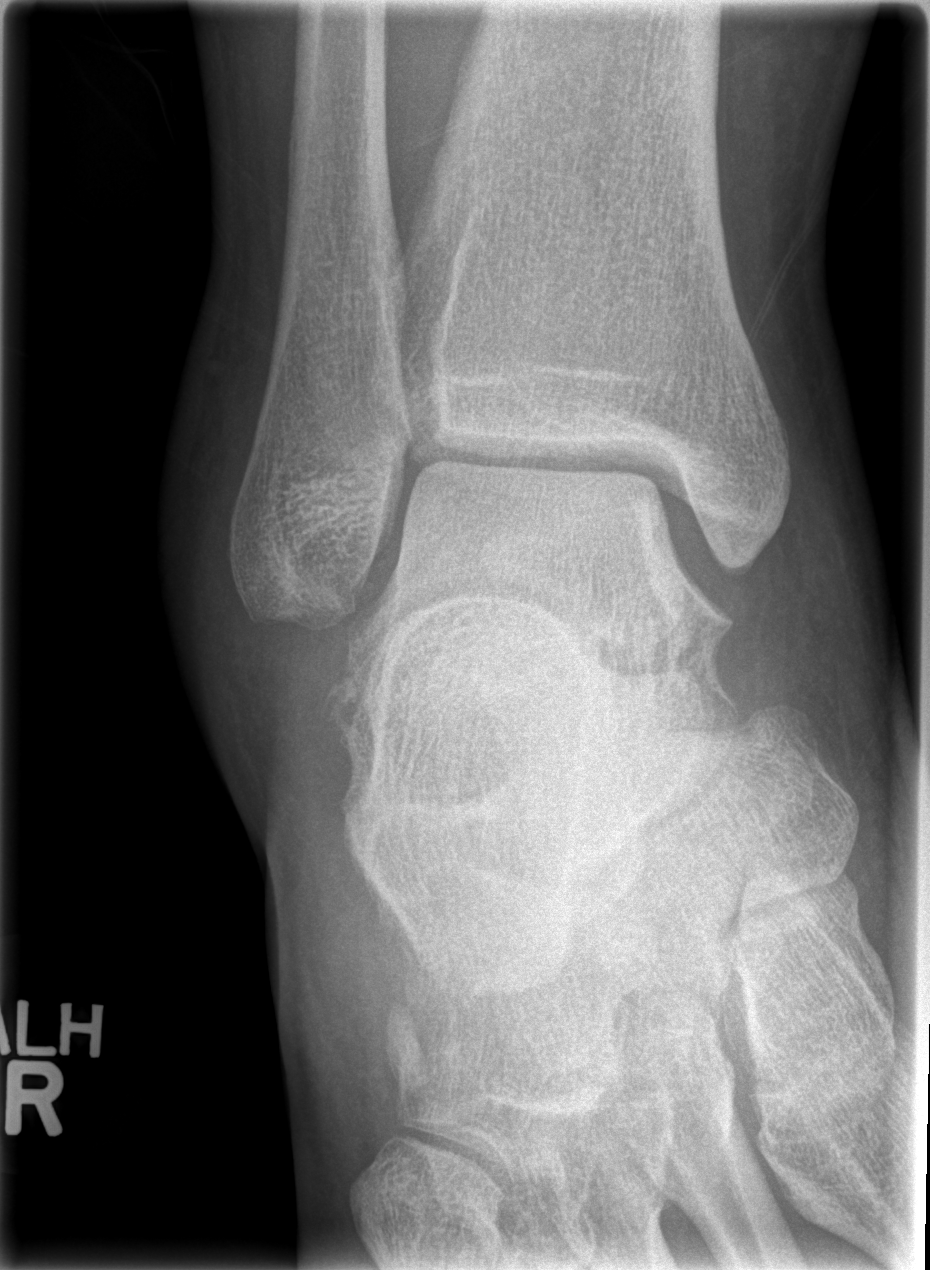

[t ankle joint lat right]
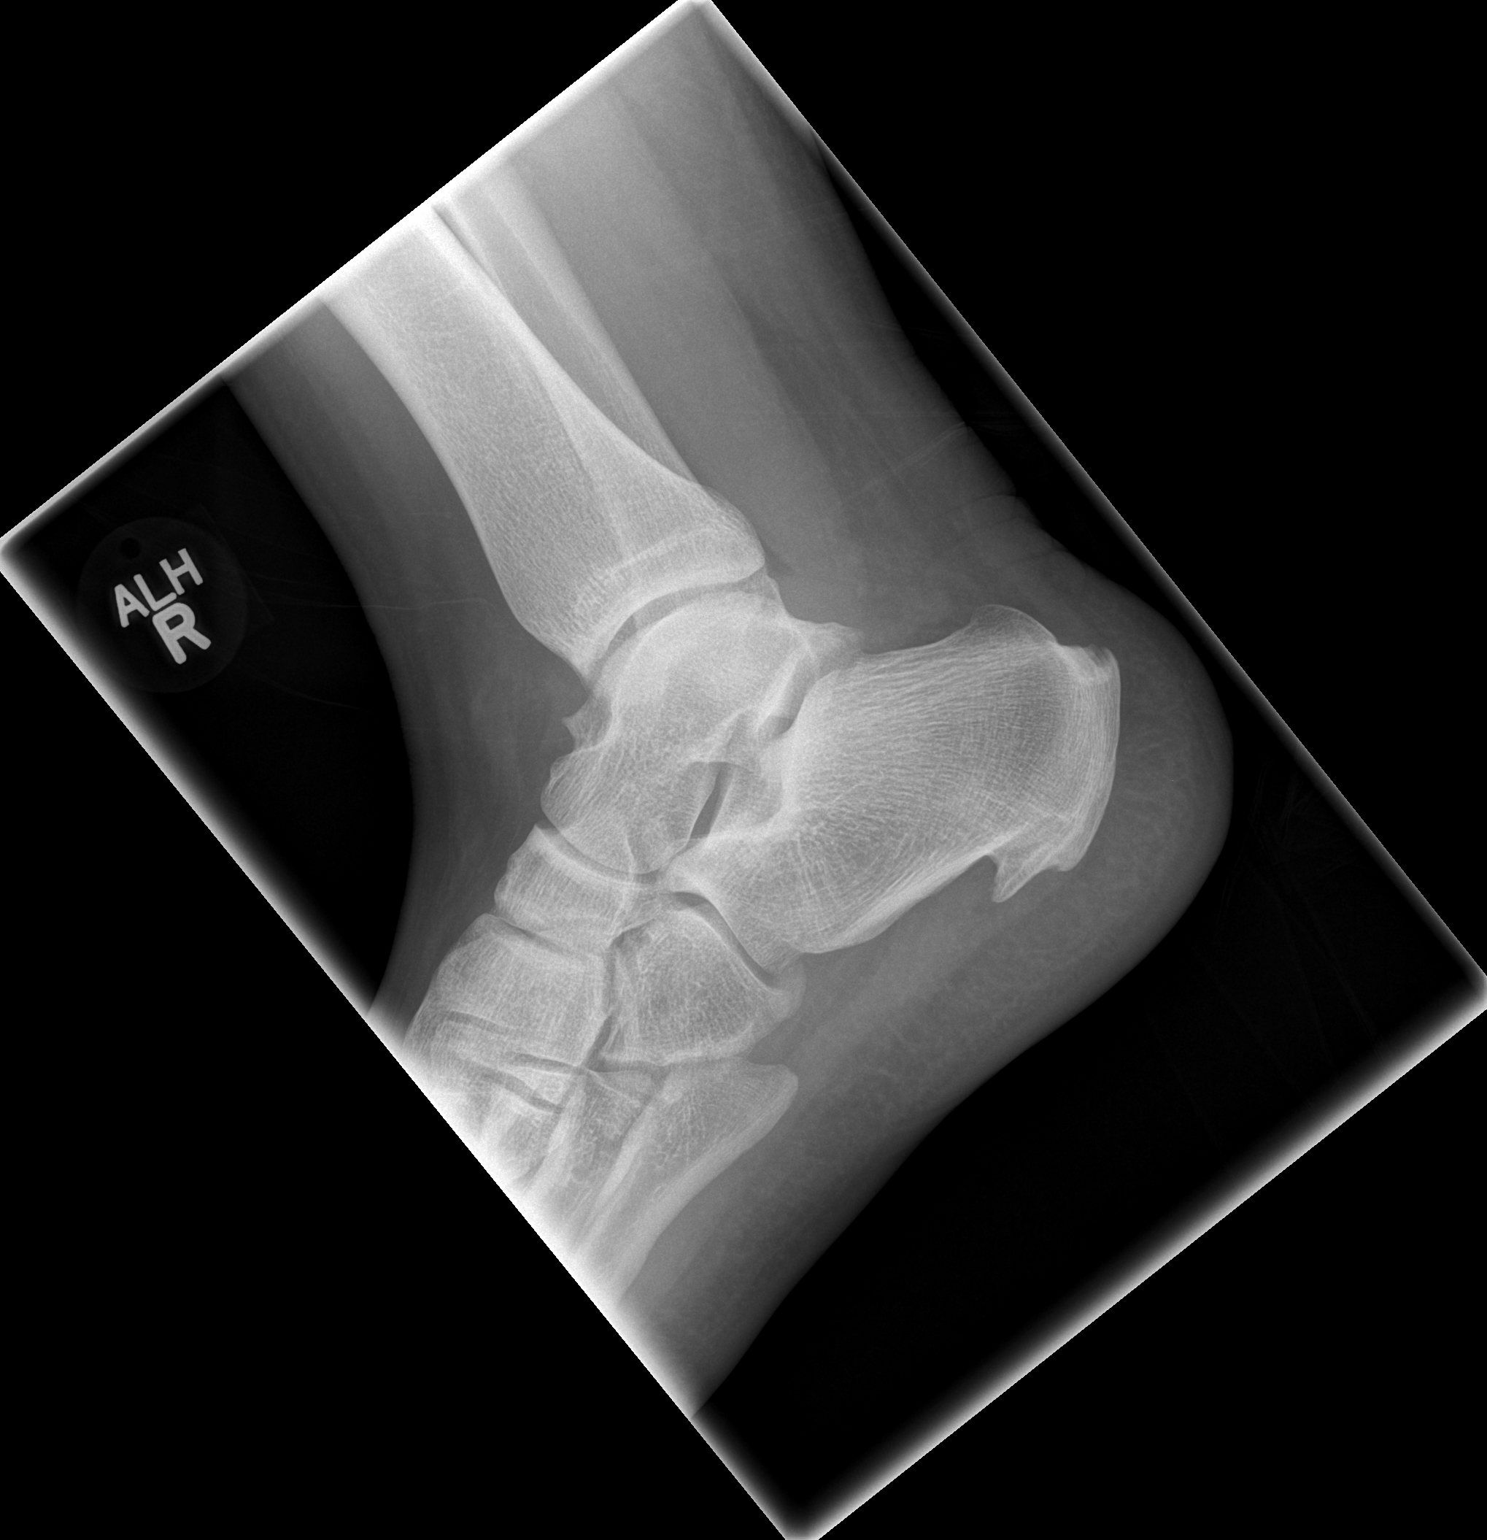

[3 of 3 positions shown; findings below may reference images not displayed]

FINDINGS: There is no evidence of disruption of the mortise. There is moderate
soft tissue swelling surrounding the ankle. The medial and lateral
malleoli are intact. There is a tiny avulsion fragment off of the
lateral aspect of the talus. This is not seen on prior studies.

There is mild talonavicular arthritis. There is a small ankle joint
effusion. There is a moderate calcaneal spur.
IMPRESSION: Ankle joint effusion with evidence sprain and with tiny avulsion
fracture off of the lateral inferior talus.

## 2021-08-18 ENCOUNTER — Emergency Department (HOSPITAL_BASED_OUTPATIENT_CLINIC_OR_DEPARTMENT_OTHER): Payer: BC Managed Care – PPO

## 2021-08-18 ENCOUNTER — Other Ambulatory Visit: Payer: Self-pay

## 2021-08-18 ENCOUNTER — Encounter (HOSPITAL_BASED_OUTPATIENT_CLINIC_OR_DEPARTMENT_OTHER): Payer: Self-pay | Admitting: Emergency Medicine

## 2021-08-18 ENCOUNTER — Emergency Department (HOSPITAL_BASED_OUTPATIENT_CLINIC_OR_DEPARTMENT_OTHER)
Admission: EM | Admit: 2021-08-18 | Discharge: 2021-08-19 | Disposition: A | Discharge status: Home / Self Care | Payer: BC Managed Care – PPO | Attending: Emergency Medicine | Admitting: Emergency Medicine

## 2021-08-18 DIAGNOSIS — T83028A Displacement of other indwelling urethral catheter, initial encounter: Secondary | ICD-10-CM | POA: Insufficient documentation

## 2021-08-18 DIAGNOSIS — N4822 Cellulitis of corpus cavernosum and penis: Secondary | ICD-10-CM | POA: Diagnosis not present

## 2021-08-18 DIAGNOSIS — N481 Balanitis: Secondary | ICD-10-CM | POA: Diagnosis not present

## 2021-08-18 DIAGNOSIS — R509 Fever, unspecified: Secondary | ICD-10-CM | POA: Insufficient documentation

## 2021-08-18 DIAGNOSIS — Z978 Presence of other specified devices: Secondary | ICD-10-CM

## 2021-08-18 DIAGNOSIS — N4829 Other inflammatory disorders of penis: Secondary | ICD-10-CM | POA: Diagnosis present

## 2021-08-18 LAB — CBC WITH DIFFERENTIAL/PLATELET
Abs Immature Granulocytes: 0.04 10*3/uL (ref 0.00–0.07)
Basophils Absolute: 0.1 10*3/uL (ref 0.0–0.1)
Basophils Relative: 1 %
Eosinophils Absolute: 0 10*3/uL (ref 0.0–0.5)
Eosinophils Relative: 0 %
HCT: 50.2 % (ref 39.0–52.0)
Hemoglobin: 17.3 g/dL — ABNORMAL HIGH (ref 13.0–17.0)
Immature Granulocytes: 0 %
Lymphocytes Relative: 24 %
Lymphs Abs: 2.9 10*3/uL (ref 0.7–4.0)
MCH: 30 pg (ref 26.0–34.0)
MCHC: 34.5 g/dL (ref 30.0–36.0)
MCV: 87 fL (ref 80.0–100.0)
Monocytes Absolute: 0.7 10*3/uL (ref 0.1–1.0)
Monocytes Relative: 6 %
Neutro Abs: 8.2 10*3/uL — ABNORMAL HIGH (ref 1.7–7.7)
Neutrophils Relative %: 69 %
Platelets: 319 10*3/uL (ref 150–400)
RBC: 5.77 MIL/uL (ref 4.22–5.81)
RDW: 12.1 % (ref 11.5–15.5)
WBC: 12 10*3/uL — ABNORMAL HIGH (ref 4.0–10.5)
nRBC: 0 % (ref 0.0–0.2)

## 2021-08-18 LAB — URINALYSIS, ROUTINE W REFLEX MICROSCOPIC
Glucose, UA: >=500 mg/dL — AB
Hgb urine dipstick: NEGATIVE
Ketones, ur: >=80 mg/dL — AB
Leukocytes,Ua: NEGATIVE
Nitrite: POSITIVE — AB
Protein, ur: 30 mg/dL — AB
Specific Gravity, Urine: 1.02 (ref 1.005–1.030)
pH: 5 (ref 5.0–8.0)

## 2021-08-18 LAB — URINALYSIS, MICROSCOPIC (REFLEX)

## 2021-08-18 LAB — COMPREHENSIVE METABOLIC PANEL
ALT: 22 U/L (ref 0–44)
AST: 19 U/L (ref 15–41)
Albumin: 4.1 g/dL (ref 3.5–5.0)
Alkaline Phosphatase: 88 U/L (ref 38–126)
Anion gap: 15 (ref 5–15)
BUN: 14 mg/dL (ref 6–20)
CO2: 18 mmol/L — ABNORMAL LOW (ref 22–32)
Calcium: 9.3 mg/dL (ref 8.9–10.3)
Chloride: 102 mmol/L (ref 98–111)
Creatinine, Ser: 0.86 mg/dL (ref 0.61–1.24)
GFR, Estimated: >60 mL/min (ref >60)
Glucose, Bld: 298 mg/dL — ABNORMAL HIGH (ref 70–99)
Potassium: 4 mmol/L (ref 3.5–5.1)
Sodium: 135 mmol/L (ref 135–145)
Total Bilirubin: 1.1 mg/dL (ref 0.3–1.2)
Total Protein: 8.2 g/dL — ABNORMAL HIGH (ref 6.5–8.1)

## 2021-08-18 LAB — LACTIC ACID, PLASMA: Lactic Acid, Venous: 1.8 mmol/L (ref 0.5–1.9)

## 2021-08-18 MED ORDER — LIDOCAINE HCL URETHRAL/MUCOSAL 2 % EX GEL
1.0000 "application " | Freq: Once | CUTANEOUS | Status: AC
  Administered 2021-08-18: 1 via URETHRAL
  Filled 2021-08-18: qty 11

## 2021-08-18 MED ORDER — SODIUM CHLORIDE 0.9 % IV SOLN
2.0000 g | Freq: Once | INTRAVENOUS | Status: AC
  Administered 2021-08-18: 2 g via INTRAVENOUS
  Filled 2021-08-18: qty 20

## 2021-08-18 MED ORDER — FENTANYL CITRATE PF 50 MCG/ML IJ SOSY
50.0000 ug | PREFILLED_SYRINGE | Freq: Once | INTRAMUSCULAR | Status: AC
  Administered 2021-08-18: 50 ug via INTRAVENOUS
  Filled 2021-08-18: qty 1

## 2021-08-18 MED ORDER — DOXYCYCLINE HYCLATE 100 MG PO CAPS: 100.0000 mg | ORAL_CAPSULE | Freq: Two times a day (BID) | ORAL | 0 refills | Status: AC

## 2021-08-18 MED ORDER — HYDROMORPHONE HCL 1 MG/ML IJ SOLN
1.0000 mg | Freq: Once | INTRAMUSCULAR | Status: AC
  Administered 2021-08-18: 1 mg via INTRAVENOUS
  Filled 2021-08-18: qty 1

## 2021-08-18 MED ORDER — IOHEXOL 300 MG/ML  SOLN
100.0000 mL | Freq: Once | INTRAMUSCULAR | Status: AC | PRN
  Administered 2021-08-18: 100 mL via INTRAVENOUS

## 2021-08-18 MED ORDER — LIDOCAINE HCL 2 % EX GEL
1.0000 | CUTANEOUS | 0 refills | Status: AC | PRN
Start: 2021-08-18 — End: ?

## 2021-08-18 NOTE — ED Notes (Signed)
Pt given ice chips

## 2021-08-18 NOTE — ED Provider Notes (Signed)
?[facility]ERGENC[facility] DEPARTMENT ?Provider Note ? ? ?CSN: [id number] ?Arrival date & time: 08/18/21  1054 ? ?  ? ?History ? ?Chief Complaint  ?Patient presents with  ? Cellulitis  ? ? ?Edward Park is a 31 y.o. male. ? ?Patient with no pertinent past medical history presents today with concerns for penile infection.  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  He states that he noticed some chafing around his penis following this which got continually worse over the next few days.  States on Friday he went to urgent care for same and was prescribed fluconazole and Keflex which she has been taking as prescribed.  States that he is continue to have worsening of his symptoms and is now unable to extract his penis from his body habitus. He states that he is also experiencing excruciating pain in his pelvic area particularly with urination. States that he last urinated at 6:30 am this morning and has since been unable to. Does endorse significant history of penile yeast infections in the past 6 months but denies any symptoms similar to this episode. Endorses some subjective fevers this morning, no nausea, vomiting, diarrhea. ? ?The history is provided by the patient. No language interpreter was used.  ? ?  ? ?Home Medications ?Prior to Admission medications   ?Medication Sig Start Date End Date Taking? Authorizing Provider  ?oxyCODONE-acetaminophen (PERCOCET/ROXICET) 5-325 MG per tablet Take 1-2 tablets by mouth every 6 (six) hours as needed for moderate pain or severe pain. 01/18/14   Noe Gens, PA-C  ?   ? ?Allergies    ?Patient has no known allergies.   ? ?Review of Systems   ?Review of Systems  ?Constitutional:  Negative for chills and fever.  ?Genitourinary:  Positive for decreased urine volume, difficulty urinating, dysuria, penile discharge and penile pain.  Negative for flank pain, scrotal swelling and testicular pain.  ?All other systems reviewed and are negative. ? ?Physical Exam ?Updated Vital Signs ?BP 117/85   Pulse 99   Temp 97.7 ?F (36.5 ?C) (Oral)   Resp 18   Ht 6' (1.829 m)   Wt (!) 181.4 kg   SpO2 95%   BMI 54.25 kg/m?  ?Physical Exam ?Vitals and nursing note reviewed. Exam conducted with a chaperone present.  ?Constitutional:   ?   General: He is not in acute distress. ?   Appearance: Normal appearance. He is normal weight. He is not ill-appearing, toxic-appearing or diaphoretic.  ?HENT:  ?   Head: Normocephalic and atraumatic.  ?Cardiovascular:  ?   Rate and Rhythm: Normal rate.  ?Pulmonary:  ?   Effort: Pulmonary effort is normal. No respiratory distress.  ?Genitourinary: ?   Comments: Unable to visualize penis as it is retracted into the patients body habitus. When attempt made to extract the penis, green fluid expressed from the area. Patient unable to tolerate further due to pain. Dried antifungal cream present around the genital area. No testicular swelling, pain or abnormality. ? ?Upon reevaluation after pain medication, now able to somewhat retract body habitus to express the head of the penis which shows erythema with surrounding yeast and purulence. ? ?See images below for further ?Musculoskeletal:     ?   General: Normal range of motion.  ?   Cervical back: Normal range of motion.  ?Skin: ?  General: Skin is warm and dry.  ?Neurological:  ?   General: No focal deficit present.  ?   Mental Status: He is alert.  ?Psychiatric:     ?   Mood and Affect: Mood normal.     ?   Behavior: Behavior normal.  ? ? ? ? ? ?ED Results / Procedures / Treatments   ?Labs ?(all labs ordered are listed, but only abnormal results are displayed) ?Labs Reviewed  ?CBC WITH DIFFERENTIAL/PLATELET - Abnormal; Notable for the following components:  ?    Result Value  ? WBC 12.0 (*)   ? Hemoglobin 17.3 (*)   ? Neutro Abs 8.2 (*)   ? All other components within normal  limits  ?URINALYSIS, ROUTINE W REFLEX MICROSCOPIC - Abnormal; Notable for the following components:  ? Color, Urine ORANGE (*)   ? Glucose, UA >=500 (*)   ? Bilirubin Urine SMALL (*)   ? Ketones, ur >=80 (*)   ? Protein, ur 30 (*)   ? Nitrite POSITIVE (*)   ? All other components within normal limits  ?COMPREHENSIVE METABOLIC PANEL - Abnormal; Notable for the following components:  ? CO2 18 (*)   ? Glucose, Bld 298 (*)   ? Total Protein 8.2 (*)   ? All other components within normal limits  ?URINALYSIS, MICROSCOPIC (REFLEX) - Abnormal; Notable for the following components:  ? Bacteria, UA RARE (*)   ? All other components within normal limits  ?CULTURE, BLOOD (ROUTINE X 2)  ?CULTURE, BLOOD (ROUTINE X 2)  ?LACTIC ACID, PLASMA  ? ? ?EKG ?None ? ?Radiology ?CT PELVIS W CONTRAST ? ?Result Date: 08/18/2021 ?CLINICAL DATA:  Soft tissue infection suspected. Complains of penile pain. Swelling and difficulty urinating. EXAM: CT PELVIS WITH CONTRAST TECHNIQUE: Multidetector CT imaging of the pelvis was performed using the standard protocol following the bolus administration of intravenous contrast. RADIATION DOSE REDUCTION: This exam was performed according to the departmental dose-optimization program which includes automated exposure control, adjustment of the mA and/or kV according to patient size and/or use of iterative reconstruction technique. CONTRAST:  18mL OMNIPAQUE IOHEXOL 300 MG/ML  SOLN COMPARISON:  Prior CT examination dated July 08, 2017 FINDINGS: Urinary Tract:  No abnormality visualized. Bowel: Scattered colonic diverticula without evidence of acute diverticulitis. Bowel loops are normal in caliber. Vascular/Lymphatic: No pathologically enlarged lymph nodes. No significant vascular abnormality seen. Reproductive: No mass or other significant abnormality. Prostate is normal in size. No inflammatory changes about the scrotum or penis. Other:  None. Musculoskeletal: Degenerative disc disease at L5-S1.  IMPRESSION: 1. No CT evidence of soft tissue fluid collection or infectious/inflammatory process. 2. Scattered colonic diverticuli without evidence of acute diverticulitis. 3.  Mild degenerate disc disease at L5-S1. Electronically Signed   By: Keane Police D.O.   On: 08/18/2021 14:25   ? ?Procedures ?Procedures  ? ? ?Medications Ordered in ED ?Medications  ?cefTRIAXone (ROCEPHIN) 2 g in sodium chloride 0.9 % 100 mL IVPB (2 g Intravenous New Bag/Given 08/18/21 1623)  ?HYDROmorphone (DILAUDID) injection 1 mg (1 mg Intravenous Given 08/18/21 1204)  ?iohexol (OMNIPAQUE) 300 MG/ML solution 100 mL (100 mLs Intravenous Contrast Given 08/18/21 1320)  ?HYDROmorphone (DILAUDID) injection 1 mg (1 mg Intravenous Given 08/18/21 1447)  ? ? ?ED Course/ Medical Decision Making/ A&P ?  ?                        ?Medical Decision Making ?Amount and/or Complexity of Data Reviewed ?Labs: ordered. ?Radiology:  ordered. ? ?Risk ?Prescription drug management. ? ? ?This patient presents to the ED for concern of penile pain, difficulty urinating, this involves an extensive number of treatment options, and is a complaint that carries with it a high risk of complications and morbidity. ? ? ?Co morbidities that complicate the patient evaluation ? ?Morbid obesity ? ? ?Lab Tests: ? ?I Ordered, and personally interpreted labs.  The pertinent results include:  WBC 12.0, lactic acid 1.8, glucose 298, bicarb 18. Glucosuria, ketonuria, proteinuria, proteinuria, positive nitrites, blood cultures pending. No other acute laboratory findings ? ? ?Imaging Studies ordered: ? ?I ordered imaging studies including CT pelvis with contrast  ?I independently visualized and interpreted imaging which showed  ?1. No CT evidence of soft tissue fluid collection or infectious/inflammatory process. ? 2. Scattered colonic diverticuli without evidence of acute diverticulitis. ?I agree with the radiologist interpretation ? ? ?Consultations Obtained: ? ?I requested consultation  with the urologist on call Dr. Alyson Ingles who reviewed images in media,  and discussed lab and imaging findings as well as pertinent plan - they recommend: discharge with oral doxycycline for balanitis and close urology follow

## 2021-08-18 NOTE — ED Notes (Signed)
Patient transported to CT 

## 2021-08-18 NOTE — ED Notes (Signed)
Pt mild distress, appears uncomfortable. A/ox4, c/o penile swelling and redness. Pt has extremely swollen penis and scrotum.  ?

## 2021-08-18 NOTE — ED Triage Notes (Signed)
Pt arrives pov, steady gait, c/o penile pain. Dx with yeast infection on 4/21, endorses swelling, difficulty urinating. Currently taking abx ?

## 2021-08-18 NOTE — Discharge Instructions (Addendum)
As we discussed, you do have balantitis.  I have attached additional information about this condition to your paperwork today.  Given the amount of pain that you are having with urination, we placed a Foley catheter into your bladder today.  This should also help with wound healing.  I have also added another antibiotic to your regimen for you to take as prescribed for management of your symptoms on top of the medications you were given in urgent care.  I have also given you a prescription for lidocaine gel which you can place on your urethral opening for additional pain relief.  You will need to call urology tomorrow to schedule an appointment for further evaluation and management of your symptoms as well as for removal of the Foley within 1 week.  I have given you a referral with a number to call to schedule an appointment. ? ?Additionally, your blood sugar was high today as well.  You will need to call your PCP to schedule an appointment in the next week for further evaluation and management of your blood sugar.  In the interim it is very important that you avoid foods that are high in carbohydrates and sugars as these tend to elevate your blood sugar more. ? ?Return if development of any new or worsening symptoms. ?

## 2021-08-18 NOTE — ED Notes (Signed)
ED Provider at bedside. 

## 2021-08-23 LAB — CULTURE, BLOOD (ROUTINE X 2)
Culture: NO GROWTH
Culture: NO GROWTH
Special Requests: ADEQUATE
